# Patient Record
Sex: Female | Born: 1975 | Race: White | Hispanic: No | Marital: Single | Smoking: Never smoker
Health system: Southern US, Community
[De-identification: ages and names within clinical notes are randomized; demographics above are authoritative.]

## PROBLEM LIST (undated history)

## (undated) DIAGNOSIS — G43909 Migraine, unspecified, not intractable, without status migrainosus: Secondary | ICD-10-CM

## (undated) DIAGNOSIS — I7300 Raynaud's syndrome without gangrene: Secondary | ICD-10-CM

## (undated) HISTORY — PX: ANKLE SURGERY: SHX546

## (undated) HISTORY — PX: KNEE SURGERY: SHX244

---

## 1999-08-04 HISTORY — PX: WISDOM TOOTH EXTRACTION: SHX21

## 2019-12-20 ENCOUNTER — Other Ambulatory Visit: Payer: Self-pay | Admitting: Family Medicine

## 2019-12-20 DIAGNOSIS — Z1231 Encounter for screening mammogram for malignant neoplasm of breast: Secondary | ICD-10-CM

## 2020-04-26 ENCOUNTER — Ambulatory Visit
Admission: EM | Admit: 2020-04-26 | Discharge: 2020-04-26 | Disposition: A | Discharge status: Home / Self Care | Payer: 59

## 2020-04-26 DIAGNOSIS — R200 Anesthesia of skin: Secondary | ICD-10-CM

## 2020-04-26 DIAGNOSIS — R030 Elevated blood-pressure reading, without diagnosis of hypertension: Secondary | ICD-10-CM | POA: Diagnosis not present

## 2020-04-26 DIAGNOSIS — R29898 Other symptoms and signs involving the musculoskeletal system: Secondary | ICD-10-CM | POA: Diagnosis not present

## 2020-04-26 HISTORY — DX: Raynaud's syndrome without gangrene: I73.00

## 2020-04-26 HISTORY — DX: Migraine, unspecified, not intractable, without status migrainosus: G43.909

## 2020-04-26 NOTE — Discharge Instructions (Addendum)
Go to the Emergency Department for evaluation of the numbness and weakness in your left hand.    Your blood pressure is elevated today at 148/93.  Please have this rechecked by your primary care provider in 2-4 weeks.

## 2020-04-26 NOTE — ED Provider Notes (Signed)
Renaldo Fiddler    CSN: [id number] Arrival date & time: 04/26/20  8338      History   Chief Complaint Chief Complaint  Patient presents with  . Migraine    x2 episodes  . extremity numbness    HPI Cindy Ortega is a 44 y.o. female.   Patient presents with numbness and weakness in her left hand since yesterday morning.  She states [place] an airplane and woke up with her hand weak and numb.  She states it is progressively getting worse.  She is unable to hold objects such as a coffee cup.  She states she had a migraine headache a few days ago and then had a another one yesterday which has now resolved.  She is concerned that she has never had migraine headaches so close together.  She denies chest pain, shortness of breath, abdominal pain, dizziness, confusion, facial asymmetry, slurred speech, weakness in her lower extremities, or other symptoms.  Patient has a history of migraines and Raynaud's.  The history is provided by the patient.    Past Medical History:  Diagnosis Date  . Migraine   . Raynaud disease     There are no problems to display for this patient.   Past Surgical History:  Procedure Laterality Date  . ANKLE SURGERY    . KNEE SURGERY    . WISDOM TOOTH EXTRACTION  2001    OB History   No obstetric history on file.      Home Medications    Prior to Admission medications   Medication Sig Start Date End Date Taking? Authorizing Provider  drospirenone-ethinyl estradiol (YAZ) 3-0.02 MG tablet Take 1 tablet by mouth daily.   Yes [provider]    Family History History reviewed. No pertinent family history.  Social History Social History   Tobacco Use  . Smoking status: Never Smoker  . Smokeless tobacco: Never Used  Substance Use Topics  . Alcohol use: Yes    Comment: occasionally  . Drug use: Not on file     Allergies   Patient has no known allergies.   Review of Systems Review of Systems    Constitutional: Negative for chills and fever.  HENT: Negative for ear pain and sore throat.   Eyes: Negative for pain and visual disturbance.  Respiratory: Negative for cough and shortness of breath.   Cardiovascular: Negative for chest pain and palpitations.  Gastrointestinal: Negative for abdominal pain and vomiting.  Genitourinary: Negative for dysuria and hematuria.  Musculoskeletal: Negative for arthralgias and back pain.  Skin: Negative for color change and rash.  Neurological: Positive for weakness, numbness and headaches. Negative for dizziness, tremors, seizures, syncope, facial asymmetry, speech difficulty and light-headedness.  All other systems reviewed and are negative.    Physical Exam Triage Vital Signs ED Triage Vitals  Enc Vitals Group     BP 04/26/20 1055 (!) 148/93     Pulse Rate 04/26/20 1055 79     Resp 04/26/20 1055 14     Temp 04/26/20 1055 99 F (37.2 C)     Temp src --      SpO2 04/26/20 1055 98 %     Weight --      Height --      Head Circumference --      Peak Flow --      Pain Score 04/26/20 1051 0     Pain Loc --      Pain Edu? --  Excl. in GC? --    No data found.  Updated Vital Signs BP (!) 148/93   Pulse 79   Temp 99 F (37.2 C)   Resp 14   SpO2 98%   Visual Acuity Right Eye Distance:   Left Eye Distance:   Bilateral Distance:    Right Eye Near:   Left Eye Near:    Bilateral Near:     Physical Exam Vitals and nursing note reviewed.  Constitutional:      General: She is not in acute distress.    Appearance: She is well-developed.  HENT:     Head: Normocephalic and atraumatic.     Mouth/Throat:     Mouth: Mucous membranes are moist.  Eyes:     Conjunctiva/sclera: Conjunctivae normal.  Cardiovascular:     Rate and Rhythm: Normal rate and regular rhythm.     Heart sounds: No murmur heard.   Pulmonary:     Effort: Pulmonary effort is normal. No respiratory distress.     Breath sounds: Normal breath sounds.   Abdominal:     Palpations: Abdomen is soft.     Tenderness: There is no abdominal tenderness.  Musculoskeletal:     Cervical back: Neck supple.  Skin:    General: Skin is warm and dry.  Neurological:     Mental Status: She is alert and oriented to person, place, and time.     Cranial Nerves: No cranial nerve deficit.     Sensory: Sensory deficit present.     Motor: Weakness present.     Gait: Gait normal.     Comments: Left hand has decreased sensation in fingers and palm; worse in 5th and 4th finger. Left hand strength is weak compared to right.  Lower extremities equal 5/5 strength.   Psychiatric:        Mood and Affect: Mood normal.        Behavior: Behavior normal.      UC Treatments / Results  Labs (all labs ordered are listed, but only abnormal results are displayed) Labs Reviewed - No data to display  EKG   Radiology No results found.  Procedures Procedures (including critical care time)  Medications Ordered in UC Medications - No data to display  Initial Impression / Assessment and Plan / UC Course  I have reviewed the triage vital signs and the nursing notes.  Pertinent labs & imaging results that were available during my care of the patient were reviewed by me and considered in my medical decision making (see chart for details).   Left hand numbness and weakness.  Elevated blood pressure reading.  Discussed limitations of evaluation here in the urgent care.  Sending patient to the ED for evaluation.  Patient agrees to plan of care.  She declines EMS and states she is stable to drive herself there as she did here.   Final Clinical Impressions(s) / UC Diagnoses   Final diagnoses:  Left hand weakness  Numbness of left hand  Elevated blood pressure reading     Discharge Instructions     Go to the Emergency Department for evaluation of the numbness and weakness in your left hand.    Your blood pressure is elevated today at 148/93.  Please have this  rechecked by your primary care provider in 2-4 weeks.         ED Prescriptions    None     I have reviewed the PDMP during this encounter.   Mickie Bail,  NP 04/26/20 1118

## 2020-04-26 NOTE — ED Triage Notes (Signed)
Patient reports she was in [place] a few days ago when she experienced a migraine. Reports she woke up the next morning to finger her fourth and fifth digit on the left hand to be numb. Reports she cannot move her fifth digit on the left hand. Reports she had another migraine yesterday. States she never has migraines that close together.

## 2020-10-07 ENCOUNTER — Other Ambulatory Visit: Payer: Self-pay

## 2020-10-07 ENCOUNTER — Ambulatory Visit
Admission: RE | Admit: 2020-10-07 | Discharge: 2020-10-07 | Disposition: A | Discharge status: Home / Self Care | Payer: 59 | Source: Ambulatory Visit | Attending: Family Medicine | Admitting: Family Medicine

## 2020-10-07 DIAGNOSIS — Z1231 Encounter for screening mammogram for malignant neoplasm of breast: Secondary | ICD-10-CM

## 2021-08-02 IMAGING — MG MM DIGITAL SCREENING BILAT W/ TOMO AND CAD
8 series · 9 of 24 positions shown · non-contrast
Comparison: Previous exam(s).

CLINICAL DATA: Screening.

EXAM:
DIGITAL SCREENING BILATERAL MAMMOGRAM WITH TOMOSYNTHESIS AND CAD
TECHNIQUE: Bilateral screening digital craniocaudal and mediolateral oblique
mammograms were obtained. Bilateral screening digital breast
tomosynthesis was performed. The images were evaluated with
computer-aided detection.

[R CC synth-2D]
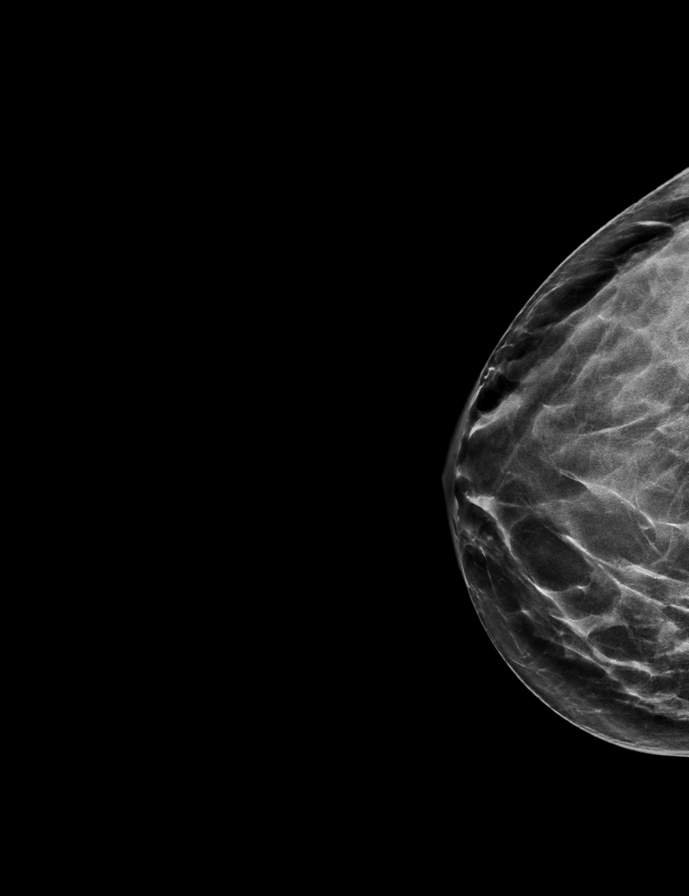

[L CC synth-2D]
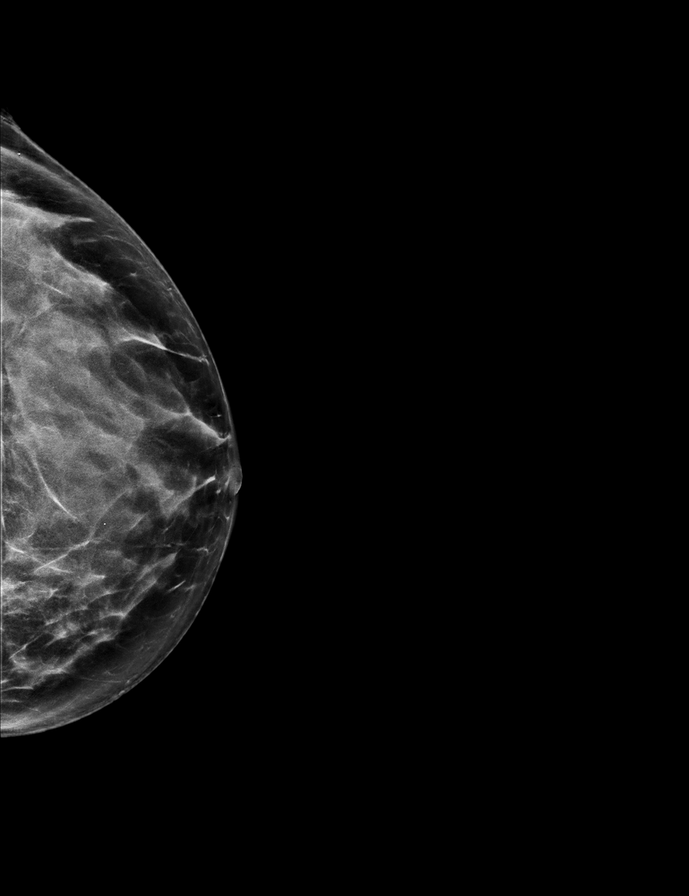

[R MLO synth-2D]
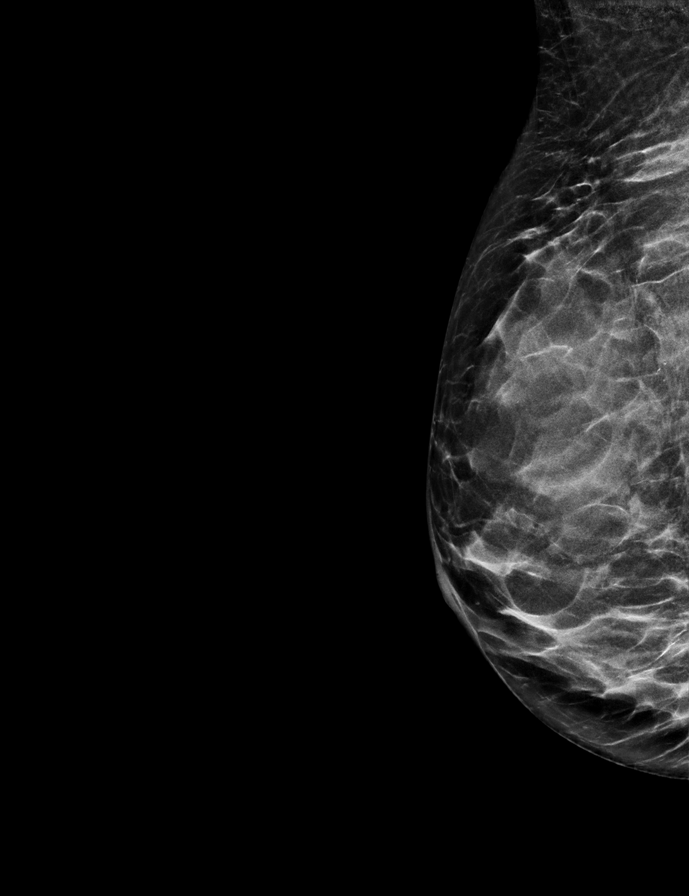

[L MLO synth-2D]
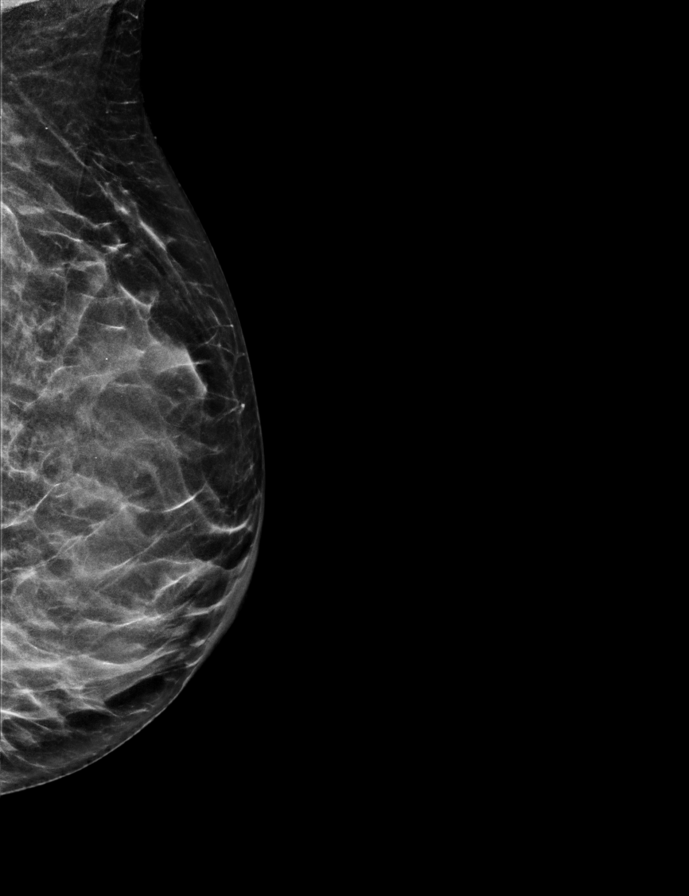

[L MLO tomo · 2 of 45 frames shown]
[frame 15/45]
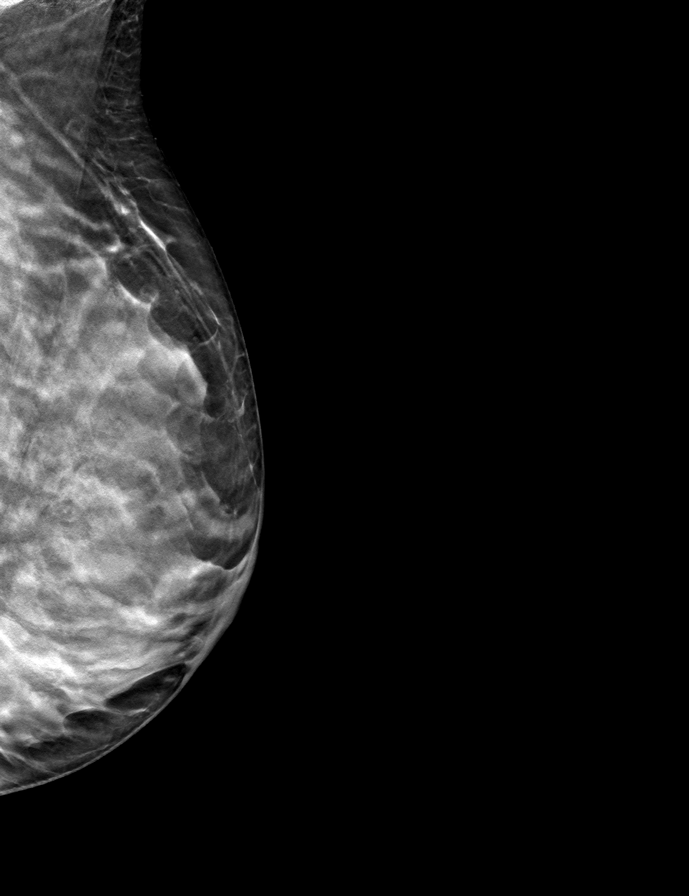
[frame 23/45]
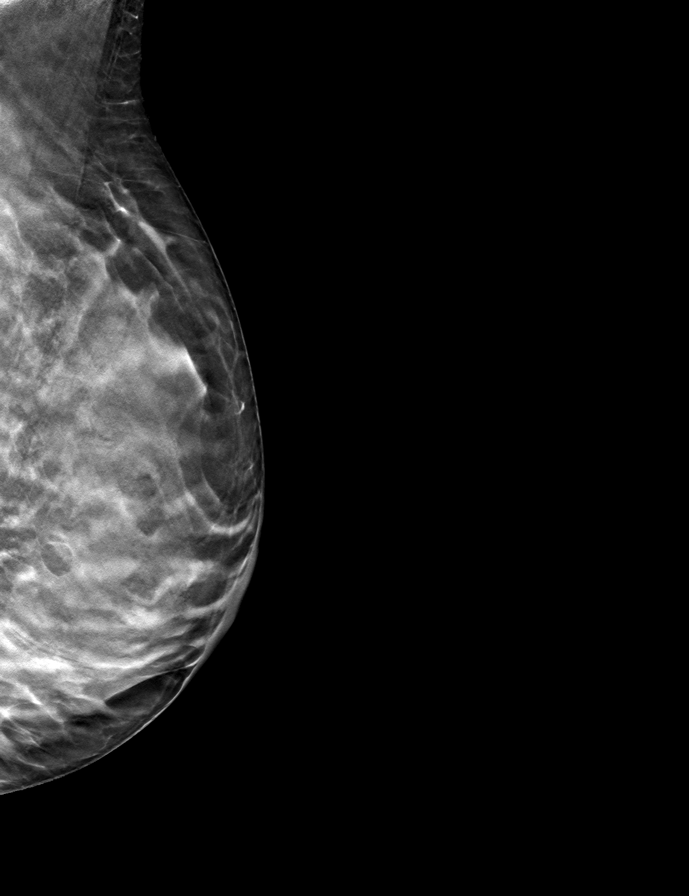

[L CC tomo · tomo slice 27/54.0]
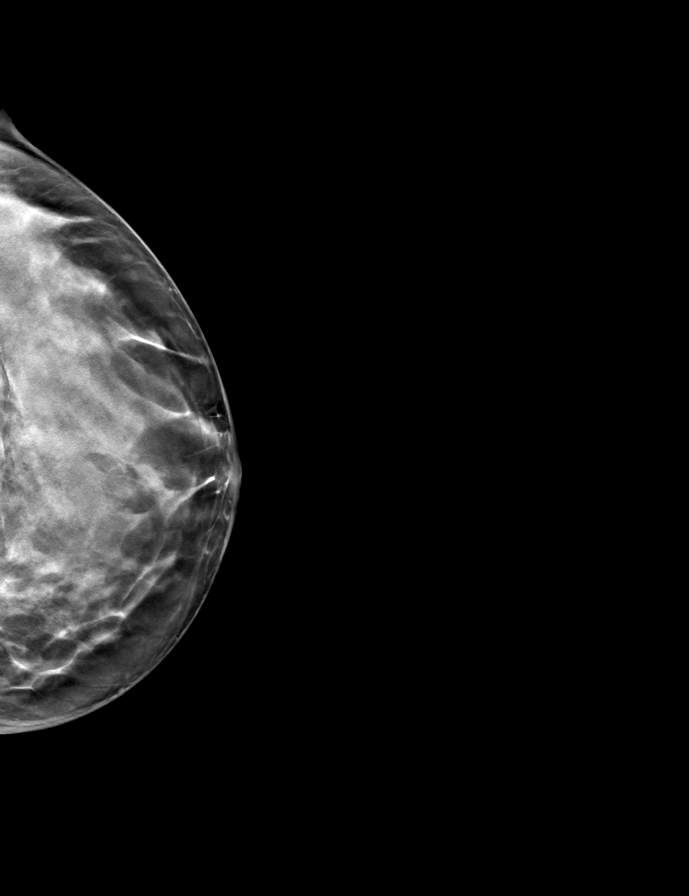

[R MLO tomo · tomo slice 24/47.0]
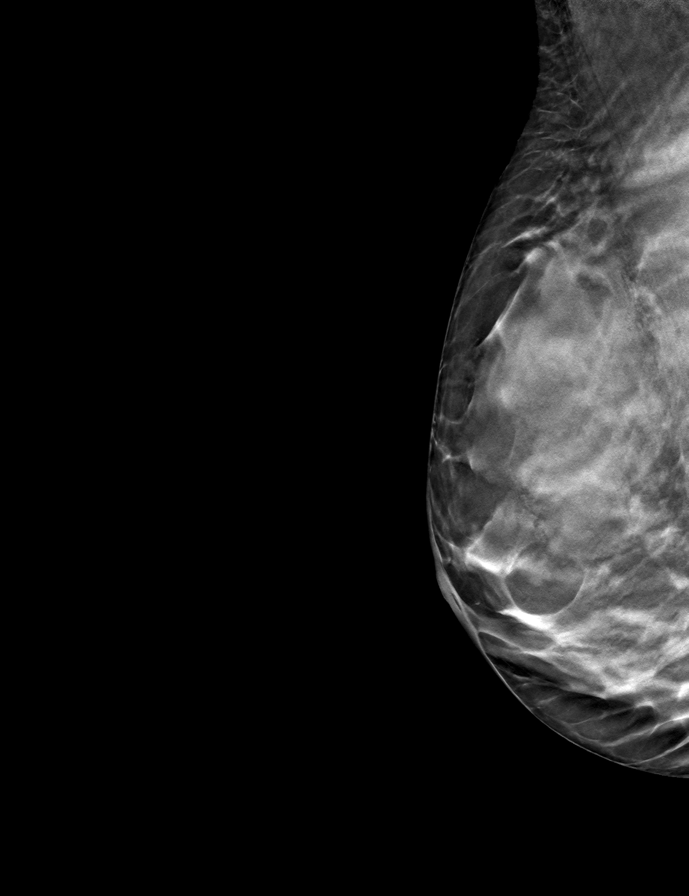

[R CC tomo · tomo slice 25/50.0]
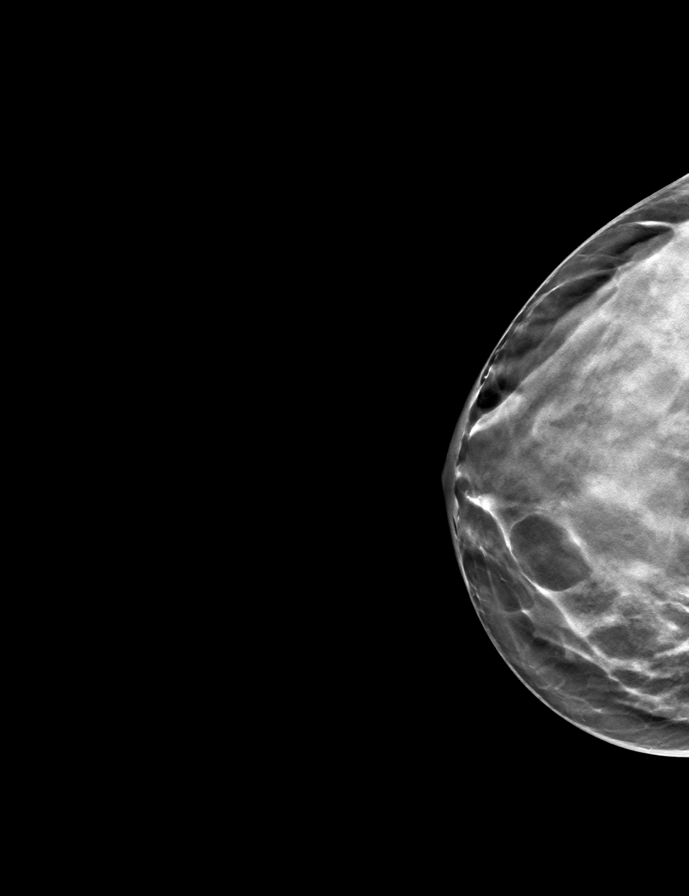

[9 of 24 positions shown; findings below may reference images not displayed]

ACR Breast Density Category d: The breast tissue is extremely dense,
which lowers the sensitivity of mammography
FINDINGS: There are no findings suspicious for malignancy. The images were
evaluated with computer-aided detection.
IMPRESSION: No mammographic evidence of malignancy. A result letter of this
screening mammogram will be mailed directly to the patient.

RECOMMENDATION:
Screening mammogram in one year. (Code:95-0-E9V)

BI-RADS CATEGORY  1: Negative.
